# Patient Record
Sex: Female | Born: 1995 | Race: White | Hispanic: No | Marital: Single | Smoking: Never smoker
Health system: Southern US, Community
[De-identification: ages and names within clinical notes are randomized; demographics above are authoritative.]

## PROBLEM LIST (undated history)

## (undated) DIAGNOSIS — F419 Anxiety disorder, unspecified: Secondary | ICD-10-CM

## (undated) DIAGNOSIS — F329 Major depressive disorder, single episode, unspecified: Secondary | ICD-10-CM

## (undated) DIAGNOSIS — J45909 Unspecified asthma, uncomplicated: Secondary | ICD-10-CM

## (undated) DIAGNOSIS — R000 Tachycardia, unspecified: Secondary | ICD-10-CM

## (undated) DIAGNOSIS — F32A Depression, unspecified: Secondary | ICD-10-CM

## (undated) HISTORY — DX: Tachycardia, unspecified: R00.0

## (undated) HISTORY — PX: APPENDECTOMY: SHX54

---
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
## 2017-01-17 ENCOUNTER — Encounter: Payer: Self-pay | Admitting: Family

## 2017-01-17 ENCOUNTER — Ambulatory Visit (INDEPENDENT_AMBULATORY_CARE_PROVIDER_SITE_OTHER): Payer: Medicaid Other | Admitting: Family

## 2017-01-17 DIAGNOSIS — F321 Major depressive disorder, single episode, moderate: Secondary | ICD-10-CM

## 2017-01-17 DIAGNOSIS — F329 Major depressive disorder, single episode, unspecified: Secondary | ICD-10-CM | POA: Insufficient documentation

## 2017-01-17 DIAGNOSIS — J4540 Moderate persistent asthma, uncomplicated: Secondary | ICD-10-CM | POA: Diagnosis not present

## 2017-01-17 DIAGNOSIS — J45909 Unspecified asthma, uncomplicated: Secondary | ICD-10-CM | POA: Insufficient documentation

## 2017-01-17 DIAGNOSIS — E669 Obesity, unspecified: Secondary | ICD-10-CM | POA: Diagnosis not present

## 2017-01-17 DIAGNOSIS — F411 Generalized anxiety disorder: Secondary | ICD-10-CM

## 2017-01-17 DIAGNOSIS — F32A Depression, unspecified: Secondary | ICD-10-CM | POA: Insufficient documentation

## 2017-01-17 MED ORDER — CITALOPRAM HYDROBROMIDE 40 MG PO TABS: 40.0000 mg | ORAL_TABLET | Freq: Every day | ORAL | 1 refills | Status: DC

## 2017-01-17 MED ORDER — BECLOMETHASONE DIPROPIONATE 40 MCG/ACT IN AERS: 2.0000 | INHALATION_SPRAY | Freq: Every day | RESPIRATORY_TRACT | 3 refills | Status: DC

## 2017-01-17 MED ORDER — MONTELUKAST SODIUM 10 MG PO TABS: 10.0000 mg | ORAL_TABLET | Freq: Every day | ORAL | 3 refills | Status: DC

## 2017-01-17 MED ORDER — HYDROXYZINE HCL 50 MG PO TABS: 50.0000 mg | ORAL_TABLET | Freq: Three times a day (TID) | ORAL | 0 refills | Status: DC | PRN

## 2017-01-17 MED ORDER — LAMOTRIGINE 100 MG PO TABS: 100.0000 mg | ORAL_TABLET | Freq: Two times a day (BID) | ORAL | 3 refills | Status: AC

## 2017-01-17 NOTE — Patient Instructions (Signed)
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
## 2017-01-22 ENCOUNTER — Ambulatory Visit: Payer: Medicaid Other | Admitting: General Surgery

## 2017-01-24 ENCOUNTER — Telehealth: Payer: Self-pay | Admitting: Family

## 2017-01-24 NOTE — Telephone Encounter (Signed)
Spoke with pt She will contact Behavioral health for appointment

## 2017-02-01 ENCOUNTER — Other Ambulatory Visit: Payer: Self-pay | Admitting: Family

## 2017-02-01 DIAGNOSIS — F411 Generalized anxiety disorder: Secondary | ICD-10-CM

## 2017-02-15 ENCOUNTER — Other Ambulatory Visit: Payer: Self-pay | Admitting: *Deleted

## 2017-02-15 DIAGNOSIS — F321 Major depressive disorder, single episode, moderate: Secondary | ICD-10-CM

## 2017-02-15 MED ORDER — BECLOMETHASONE DIPROP HFA 40 MCG/ACT IN AERB: 1.0000 | INHALATION_SPRAY | Freq: Two times a day (BID) | RESPIRATORY_TRACT | 3 refills | Status: DC

## 2017-02-28 ENCOUNTER — Ambulatory Visit (INDEPENDENT_AMBULATORY_CARE_PROVIDER_SITE_OTHER): Payer: Medicaid Other | Admitting: Family

## 2017-02-28 ENCOUNTER — Encounter: Payer: Self-pay | Admitting: Family

## 2017-02-28 VITALS — BP 133/80 | HR 83 | Temp 97.8°F | Ht 64.0 in | Wt 209.6 lb

## 2017-02-28 DIAGNOSIS — R110 Nausea: Secondary | ICD-10-CM | POA: Diagnosis not present

## 2017-02-28 DIAGNOSIS — F411 Generalized anxiety disorder: Secondary | ICD-10-CM | POA: Diagnosis not present

## 2017-02-28 DIAGNOSIS — R635 Abnormal weight gain: Secondary | ICD-10-CM

## 2017-02-28 DIAGNOSIS — F321 Major depressive disorder, single episode, moderate: Secondary | ICD-10-CM | POA: Diagnosis not present

## 2017-02-28 DIAGNOSIS — R300 Dysuria: Secondary | ICD-10-CM

## 2017-02-28 LAB — MICROSCOPIC EXAMINATION
Bacteria, UA: NONE SEEN
RBC MICROSCOPIC, UA: NONE SEEN /HPF (ref 0–?)
RENAL EPITHEL UA: NONE SEEN /HPF

## 2017-02-28 LAB — URINALYSIS, COMPLETE
BILIRUBIN UA: NEGATIVE
Glucose, UA: NEGATIVE
Leukocytes, UA: NEGATIVE
Nitrite, UA: NEGATIVE
PH UA: 5.5 (ref 5.0–7.5)
RBC UA: NEGATIVE
Specific Gravity, UA: >1.03 — ABNORMAL HIGH (ref 1.005–1.030)
Urobilinogen, Ur: 0.2 mg/dL (ref 0.2–1.0)

## 2017-02-28 MED ORDER — HYDROXYZINE HCL 50 MG PO TABS: 50.0000 mg | ORAL_TABLET | Freq: Three times a day (TID) | ORAL | 1 refills | Status: DC | PRN

## 2017-02-28 NOTE — Patient Instructions (Signed)

## 2017-02-28 NOTE — Progress Notes (Signed)
Subjective:    Patient ID: Karina Hall, female    DOB: 15-Feb-1996, 21 y.o.   MRN: [id number]  PT presents to the office today to recheck GAD and Depression. Pt states her mother passed away on 02/09/17, the father of her child went to jail, and her sister started having seizures since our last visit. Pt states she is terrified to talk to psychologists and does not want to see them. PT states she has appt next month at [facility], but may call [facility].   PT has nexplanon in place since January. PT states she was bleeding, had cramps, and vaginal discharge. PT states she is having "morning sickness" and wants to have pregnancy test today. Complaining of fatigue and weight gain and wants her thyroid checked.  Anxiety  Presents for follow-up visit. Symptoms include depressed mood, excessive worry, irritability, nausea, nervous/anxious behavior, panic and restlessness. Symptoms occur occasionally. The severity of symptoms is severe. The quality of sleep is good.    Depression         This is a chronic problem.  The current episode started more than 1 year ago.   The onset quality is gradual.   The problem occurs constantly.  The problem has been gradually worsening since onset.  Associated symptoms include fatigue, helplessness, hopelessness, irritable, restlessness and sad.     The symptoms are aggravated by family issues and medication.  Past treatments include SSRIs - Selective serotonin reuptake inhibitors.  Past medical history includes anxiety.   Dysuria   This is a new problem. The current episode started 1 to 4 weeks ago. The problem has been gradually worsening. The quality of the pain is described as burning. The pain is at a severity of 4/10. The pain is moderate. Associated symptoms include a discharge, frequency, hesitancy, nausea and urgency. Pertinent negatives include no hematuria. She has tried increased fluids for the symptoms. The treatment provided mild relief.       Review of Systems  Constitutional: Positive for fatigue and irritability.  Gastrointestinal: Positive for nausea.  Genitourinary: Positive for dysuria, frequency, hesitancy and urgency. Negative for hematuria.  Psychiatric/Behavioral: Positive for depression. The patient is nervous/anxious.   All other systems reviewed and are negative.      Objective:   Physical Exam  Constitutional: She is oriented to person, place, and time. She appears well-developed and well-nourished. She is irritable. No distress.  HENT:  Head: Normocephalic.  Eyes: Pupils are equal, round, and reactive to light.  Neck: Normal range of motion. Neck supple. No thyromegaly present.  Cardiovascular: Normal rate, regular rhythm, normal heart sounds and intact distal pulses.   No murmur heard. Pulmonary/Chest: Effort normal and breath sounds normal. No respiratory distress. She has no wheezes.  Abdominal: Soft. Bowel sounds are normal. She exhibits no distension. There is no tenderness.  Musculoskeletal: Normal range of motion. She exhibits no edema or tenderness.  Neurological: She is alert and oriented to person, place, and time.  Skin: Skin is warm and dry.  Psychiatric: Her behavior is normal. Judgment and thought content normal. Her mood appears anxious.  Pt tearful  Vitals reviewed.     BP 133/80   Pulse 83   Temp 97.8 F (36.6 C) (Oral)   Ht 5' 4"  (1.626 m)   Wt 209 lb 9.6 oz (95.1 kg)   BMI 35.98 kg/m      Assessment & Plan:  1. Current moderate episode of major depressive disorder without prior episode ([place])  Continue Lamictal and Celexa  Stress management  - CMP14+EGFR  2. GAD (generalized anxiety disorder) Continue Lamictal and Celexa  Stress management  - CMP14+EGFR - hydrOXYzine (ATARAX/VISTARIL) 50 MG tablet; Take 1 tablet (50 mg total) by mouth 3 (three) times daily as needed.  Dispense: 90 tablet; Refill: 1  3. Nausea - CMP14+EGFR  4. Weight gain - TSH -  CMP14+EGFR  5. Dysuria - Urinalysis, Complete - CMP14+EGFR   Continue all meds I do not believe she is pregnant since she still has nexplanon in place Labs pending Long discussion that I could not prescribe Benzo's today, She needs to follow up with [place] prn   Evelina Dun, FNP

## 2017-03-01 LAB — CMP14+EGFR
A/G RATIO: 1.7 (ref 1.2–2.2)
ALBUMIN: 4.6 g/dL (ref 3.5–5.5)
ALK PHOS: 85 IU/L (ref 43–101)
ALT: 12 IU/L (ref 0–32)
AST: 17 IU/L (ref 0–40)
BUN / CREAT RATIO: 10 (ref 9–23)
BUN: 10 mg/dL (ref 6–20)
CHLORIDE: 102 mmol/L (ref 96–106)
CO2: 22 mmol/L (ref 20–29)
Calcium: 9.7 mg/dL (ref 8.7–10.2)
Creatinine, Ser: 1.02 mg/dL — ABNORMAL HIGH (ref 0.57–1.00)
GFR calc non Af Amer: 80 mL/min/{1.73_m2} (ref >59)
GFR, EST AFRICAN AMERICAN: 93 mL/min/{1.73_m2} (ref >59)
GLOBULIN, TOTAL: 2.7 g/dL (ref 1.5–4.5)
GLUCOSE: 81 mg/dL (ref 65–99)
POTASSIUM: 4.2 mmol/L (ref 3.5–5.2)
SODIUM: 139 mmol/L (ref 134–144)
Total Protein: 7.3 g/dL (ref 6.0–8.5)

## 2017-03-01 LAB — TSH: TSH: 1.86 u[IU]/mL (ref 0.450–4.500)
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
## 2017-03-07 ENCOUNTER — Ambulatory Visit (HOSPITAL_COMMUNITY): Payer: Self-pay | Admitting: Psychiatry

## 2017-04-05 ENCOUNTER — Telehealth: Payer: Self-pay | Admitting: *Deleted

## 2017-04-05 NOTE — Telephone Encounter (Signed)
[facility] no longer making Qvar 40 mcg Please advise on alternative

## 2017-04-06 MED ORDER — BECLOMETHASONE DIPROP HFA 40 MCG/ACT IN AERB: 2.0000 | INHALATION_SPRAY | Freq: Two times a day (BID) | RESPIRATORY_TRACT | 6 refills | Status: DC

## 2017-04-06 NOTE — Addendum Note (Signed)
Addended by: Jannifer RodneyHAWKS, Deniyah Dillavou A on: 04/06/2017 11:49 AM   Modules accepted: Orders

## 2017-04-06 NOTE — Telephone Encounter (Signed)
QVar changed to QVar redihaler

## 2017-04-27 ENCOUNTER — Other Ambulatory Visit: Payer: Self-pay | Admitting: Family

## 2017-04-27 DIAGNOSIS — F411 Generalized anxiety disorder: Secondary | ICD-10-CM

## 2017-05-07 ENCOUNTER — Telehealth: Payer: Self-pay | Admitting: Family

## 2017-05-07 NOTE — Telephone Encounter (Signed)
Please advise 

## 2017-05-07 NOTE — Telephone Encounter (Signed)
Aware of provider's advise. 

## 2017-05-07 NOTE — Telephone Encounter (Signed)
I do not see birth control on pt's med list. Pt needs to go to store and get pregnancy test. ([facility]) If she is pregnant needs to make appt.

## 2017-05-10 ENCOUNTER — Encounter (HOSPITAL_COMMUNITY): Payer: Self-pay | Admitting: Cardiology

## 2017-05-10 ENCOUNTER — Emergency Department (HOSPITAL_COMMUNITY)
Admission: EM | Admit: 2017-05-10 | Discharge: 2017-05-10 | Disposition: A | Discharge status: Home / Self Care | Payer: Medicaid Other | Attending: Emergency Medicine | Admitting: Emergency Medicine

## 2017-05-10 DIAGNOSIS — Z79899 Other long term (current) drug therapy: Secondary | ICD-10-CM | POA: Insufficient documentation

## 2017-05-10 DIAGNOSIS — J029 Acute pharyngitis, unspecified: Secondary | ICD-10-CM | POA: Insufficient documentation

## 2017-05-10 DIAGNOSIS — J45909 Unspecified asthma, uncomplicated: Secondary | ICD-10-CM | POA: Diagnosis not present

## 2017-05-10 LAB — POC URINE PREG, ED: Preg Test, Ur: NEGATIVE

## 2017-05-10 LAB — RAPID STREP SCREEN (MED CTR MEBANE ONLY): Streptococcus, Group A Screen (Direct): NEGATIVE

## 2017-05-10 NOTE — ED Provider Notes (Signed)
[facility] EMERGENCY DEPARTMENT Provider Note   CSN: [id number] Arrival date & time: 05/10/17  [id number]     History   Chief Complaint Chief Complaint  Patient presents with  . Sore Throat    HPI Karina Hall is a 21 y.o. female.  Patient presents with cough and sore throat for 2 days. Patient was exposed to strep.  Mild aches without fever. No significant medical problems. Patient also is late for her menstrual cycle and took a home pregnancy test is negative. Patient's concerned she may be pregnant. No vaginal symptoms.      Past Medical History:  Diagnosis Date  . Anxiety   . Asthma   . Depression   . Tachycardia     Patient Active Problem List   Diagnosis Date Noted  . GAD (generalized anxiety disorder) 01/17/2017  . Asthma 01/17/2017  . Depression 01/17/2017  . Obesity (BMI 30-39.9) 01/17/2017  . Calculus of gallbladder with acute cholecystitis without obstruction     Past Surgical History:  Procedure Laterality Date  . APPENDECTOMY    . CESAREAN SECTION    . CHOLECYSTECTOMY N/A 11/09/2016   Procedure: LAPAROSCOPIC CHOLECYSTECTOMY;  Surgeon: Franky MachoJenkins, Mark, MD;  Location: [facility];  Service: General;  Laterality: N/A;    OB History    Gravida Para Term Preterm AB Living             1   SAB TAB Ectopic Multiple Live Births                   Home Medications    Prior to Admission medications   Medication Sig Start Date End Date Taking? Authorizing Provider  albuterol (PROVENTIL HFA;VENTOLIN HFA) 108 (90 Base) MCG/ACT inhaler Inhale 2 puffs into the lungs every 6 (six) hours as needed for wheezing or shortness of breath.    [provider]  beclomethasone (QVAR REDIHALER) 40 MCG/ACT inhaler Inhale 2 puffs into the lungs 2 (two) times daily. 04/06/17   Junie SpencerHawks, Christy A, FNP  citalopram (CELEXA) 40 MG tablet Take 1 tablet (40 mg total) by mouth daily. 01/17/17   Jannifer RodneyHawks, Christy A, FNP  hydrOXYzine (ATARAX/VISTARIL) 50 MG tablet TAKE 1 TABLET (50 MG  TOTAL) BY MOUTH 3 (THREE) TIMES DAILY AS NEEDED. 04/29/17   Jannifer RodneyHawks, Christy A, FNP  ibuprofen (ADVIL,MOTRIN) 800 MG tablet TAKE 1 TABLET BY MOUTH THREE TIMES DAILY AS NEEDED 02/05/17   [provider]  lamoTRIgine (LAMICTAL) 100 MG tablet Take 1 tablet (100 mg total) by mouth 2 (two) times daily. 01/17/17   Jannifer RodneyHawks, Christy A, FNP  montelukast (SINGULAIR) 10 MG tablet Take 1 tablet (10 mg total) by mouth at bedtime. 01/17/17   Junie SpencerHawks, Christy A, FNP    Family History Family History  Problem Relation Age of Onset  . Hypertension Mother   . Supraventricular tachycardia Mother   . Hypertension Father   . Hypertension Other   . Diabetes Other   . Heart disease Other     Social History Social History   Tobacco Use  . Smoking status: Never Smoker  . Smokeless tobacco: Never Used  Substance Use Topics  . Alcohol use: Yes    Alcohol/week: 0.0 oz  . Drug use: No     Allergies   Penicillins and Pomegranate [punica]   Review of Systems Review of Systems  Constitutional: Negative for chills and fever.  HENT: Positive for congestion.   Respiratory: Positive for cough. Negative for shortness of breath.   Cardiovascular: Negative  for chest pain.  Gastrointestinal: Negative for abdominal pain and vomiting.  Genitourinary: Negative for dysuria.  Musculoskeletal: Negative for back pain, neck pain and neck stiffness.  Skin: Negative for rash.  Neurological: Negative for light-headedness and headaches.     Physical Exam Updated Vital Signs BP (!) 142/81   Pulse 86   Temp 97.8 F (36.6 C) (Oral)   Resp 18   Ht 5\' 5"  (1.651 m)   Wt 95.3 kg (210 lb)   LMP 04/30/2017   SpO2 100%   BMI 34.95 kg/m   Physical Exam  Constitutional: She appears well-developed and well-nourished.  HENT:  Head: Normocephalic.  Mouth/Throat: Uvula is midline, oropharynx is clear and moist and mucous membranes are normal. No oral lesions. No uvula swelling. No oropharyngeal exudate or posterior  oropharyngeal edema.  Cardiovascular: Normal rate.  Pulmonary/Chest: Effort normal and breath sounds normal.  Nursing note and vitals reviewed.    ED Treatments / Results  Labs (all labs ordered are listed, but only abnormal results are displayed) Labs Reviewed  RAPID STREP SCREEN (NOT AT [facility])  CULTURE, GROUP A STREP ([facility])  POC URINE PREG, ED    EKG  EKG Interpretation None       Radiology No results found.  Procedures Procedures (including critical care time)  Medications Ordered in ED Medications - No data to display   Initial Impression / Assessment and Plan / ED Course  I have reviewed the triage vital signs and the nursing notes.  Pertinent labs & imaging results that were available during my care of the patient were reviewed by me and considered in my medical decision making (see chart for details).    Patient presents with likely viral syndrome however strep exposure plan for strep test and pregnancy test due to missed menstrual cycle.  Results and differential diagnosis were discussed with the patient/parent/guardian. Xrays were independently reviewed by myself.  Close follow up outpatient was discussed, comfortable with the plan.   Medications - No data to display  Vitals:   05/10/17 0802 05/10/17 0803  BP: (!) 142/81   Pulse: 86   Resp: 18   Temp: 97.8 F (36.6 C)   TempSrc: Oral   SpO2: 100%   Weight:  95.3 kg (210 lb)  Height:  5\' 5"  (1.651 m)    Final diagnoses:  Acute pharyngitis, unspecified etiology     Final Clinical Impressions(s) / ED Diagnoses   Final diagnoses:  Acute pharyngitis, unspecified etiology    ED Discharge Orders    None       Blane OharaZavitz, Merideth Bosque, MD 05/10/17 [phone]

## 2017-05-10 NOTE — ED Triage Notes (Signed)
C/o sore throat and cough times 2 days.

## 2017-05-10 NOTE — Discharge Instructions (Addendum)
Take tylenol for pain and fevers. Stay well hydrated  If you were given medicines take as directed.  If you are on coumadin or contraceptives realize their levels and effectiveness is altered by many different medicines.  If you have any reaction (rash, tongues swelling, other) to the medicines stop taking and see a physician.    If your blood pressure was elevated in the ER make sure you follow up for management with a primary doctor or return for chest pain, shortness of breath or stroke symptoms.  Please follow up as directed and return to the ER or see a physician for new or worsening symptoms.  Thank you. Vitals:   05/10/17 0802 05/10/17 0803  BP: (!) 142/81   Pulse: 86   Resp: 18   Temp: 97.8 F (36.6 C)   TempSrc: Oral   SpO2: 100%   Weight:  95.3 kg (210 lb)  Height:  5\' 5"  (1.651 m)

## 2017-05-12 LAB — CULTURE, GROUP A STREP (THRC)

## 2017-05-13 ENCOUNTER — Ambulatory Visit: Payer: Self-pay | Admitting: Family

## 2017-06-17 ENCOUNTER — Other Ambulatory Visit: Payer: Self-pay | Admitting: Family

## 2017-06-17 DIAGNOSIS — F411 Generalized anxiety disorder: Secondary | ICD-10-CM

## 2017-06-18 ENCOUNTER — Telehealth: Payer: Self-pay | Admitting: Family

## 2017-06-18 ENCOUNTER — Telehealth: Payer: Self-pay | Admitting: *Deleted

## 2017-06-18 MED ORDER — BECLOMETHASONE DIPROP HFA 40 MCG/ACT IN AERB: 2.0000 | INHALATION_SPRAY | Freq: Two times a day (BID) | RESPIRATORY_TRACT | 5 refills | Status: AC

## 2017-06-18 NOTE — Telephone Encounter (Signed)
Fax received Regular Qvar no longer available Please Rx Avar 40 [facility] [address]

## 2017-06-18 NOTE — Telephone Encounter (Signed)
Prescription sent to pharmacy.

## 2017-06-24 NOTE — Telephone Encounter (Signed)
No response from patient. Note filed. 

## 2017-07-14 ENCOUNTER — Other Ambulatory Visit: Payer: Self-pay | Admitting: Family

## 2017-07-14 DIAGNOSIS — F411 Generalized anxiety disorder: Secondary | ICD-10-CM

## 2017-07-14 DIAGNOSIS — J4540 Moderate persistent asthma, uncomplicated: Secondary | ICD-10-CM

## 2017-07-14 DIAGNOSIS — F321 Major depressive disorder, single episode, moderate: Secondary | ICD-10-CM

## 2017-07-17 ENCOUNTER — Other Ambulatory Visit: Payer: Self-pay | Admitting: Family

## 2017-07-17 DIAGNOSIS — J4540 Moderate persistent asthma, uncomplicated: Secondary | ICD-10-CM

## 2017-07-17 DIAGNOSIS — F411 Generalized anxiety disorder: Secondary | ICD-10-CM

## 2017-07-18 NOTE — Telephone Encounter (Signed)
Last seen 02/28/17

## 2017-07-28 ENCOUNTER — Emergency Department (HOSPITAL_COMMUNITY): Payer: Medicaid Other

## 2017-07-28 ENCOUNTER — Other Ambulatory Visit: Payer: Self-pay

## 2017-07-28 ENCOUNTER — Emergency Department (HOSPITAL_COMMUNITY)
Admission: EM | Admit: 2017-07-28 | Discharge: 2017-07-28 | Disposition: A | Discharge status: Home / Self Care | Payer: Medicaid Other | Attending: Emergency Medicine | Admitting: Emergency Medicine

## 2017-07-28 ENCOUNTER — Encounter (HOSPITAL_COMMUNITY): Payer: Self-pay | Admitting: Emergency Medicine

## 2017-07-28 DIAGNOSIS — Z79899 Other long term (current) drug therapy: Secondary | ICD-10-CM | POA: Insufficient documentation

## 2017-07-28 DIAGNOSIS — Y999 Unspecified external cause status: Secondary | ICD-10-CM | POA: Insufficient documentation

## 2017-07-28 DIAGNOSIS — S9032XA Contusion of left foot, initial encounter: Secondary | ICD-10-CM

## 2017-07-28 DIAGNOSIS — Y929 Unspecified place or not applicable: Secondary | ICD-10-CM | POA: Insufficient documentation

## 2017-07-28 DIAGNOSIS — J45909 Unspecified asthma, uncomplicated: Secondary | ICD-10-CM | POA: Insufficient documentation

## 2017-07-28 DIAGNOSIS — S99922A Unspecified injury of left foot, initial encounter: Secondary | ICD-10-CM | POA: Diagnosis present

## 2017-07-28 DIAGNOSIS — W231XXA Caught, crushed, jammed, or pinched between stationary objects, initial encounter: Secondary | ICD-10-CM | POA: Diagnosis not present

## 2017-07-28 DIAGNOSIS — Y939 Activity, unspecified: Secondary | ICD-10-CM | POA: Insufficient documentation

## 2017-07-28 NOTE — ED Triage Notes (Signed)
Pt states she ran over her foot with a "pallet jack" 3 weeks ago. Pt c/o pain and swelling. Pt ambulatory.

## 2017-07-28 NOTE — Discharge Instructions (Signed)
Elevate and apply ice packs on and off to your foot when possible.  Wear the Ace wrap as needed for support, but do not wear continuously and remove at bedtime.  You may continue ibuprofen or Aleve as directed if needed for pain.  Call Dr. Mort SawyersHarrison's office in 1 week if symptoms are not improving.

## 2017-07-28 NOTE — ED Provider Notes (Signed)
[facility] EMERGENCY DEPARTMENT Provider Note   CSN: [id number] Arrival date & time: 07/28/17  2029     History   Chief Complaint Chief Complaint  Patient presents with  . Foot Injury    HPI Karina Hall is a 22 y.o. female.  HPI   Karina Hall is a 22 y.o. female who presents to the Emergency Department complaining of left foot pain for 3 weeks.  She states that a pallet jack rolled over her foot at the time her pain began.  She notes persistent pain and swelling to her foot that is worse with weightbearing.  She reports having a small area of bruising to the top of her foot that when pressed causes numbness and tingling to her toes.  She is tried ice and heat, Tylenol and ibuprofen without relief.  She denies fall, open wound, numbness or tingling proximal to the ankle.     Past Medical History:  Diagnosis Date  . Anxiety   . Asthma   . Depression   . Tachycardia     Patient Active Problem List   Diagnosis Date Noted  . GAD (generalized anxiety disorder) 01/17/2017  . Asthma 01/17/2017  . Depression 01/17/2017  . Obesity (BMI 30-39.9) 01/17/2017  . Calculus of gallbladder with acute cholecystitis without obstruction     Past Surgical History:  Procedure Laterality Date  . APPENDECTOMY    . CESAREAN SECTION    . CESAREAN SECTION    . CHOLECYSTECTOMY N/A 11/09/2016   Procedure: LAPAROSCOPIC CHOLECYSTECTOMY;  Surgeon: Franky Macho, MD;  Location: [facility];  Service: General;  Laterality: N/A;    OB History    Gravida Para Term Preterm AB Living             1   SAB TAB Ectopic Multiple Live Births                   Home Medications    Prior to Admission medications   Medication Sig Start Date End Date Taking? Authorizing Provider  albuterol (PROVENTIL HFA;VENTOLIN HFA) 108 (90 Base) MCG/ACT inhaler Inhale 2 puffs into the lungs every 6 (six) hours as needed for wheezing or shortness of breath.    [provider]  beclomethasone (QVAR  REDIHALER) 40 MCG/ACT inhaler Inhale 2 puffs into the lungs 2 (two) times daily. 06/18/17   Junie Spencer, FNP  citalopram (CELEXA) 40 MG tablet TAKE 1 TABLET BY MOUTH EVERY DAY 07/15/17   Jannifer Rodney A, FNP  citalopram (CELEXA) 40 MG tablet TAKE 1 TABLET BY MOUTH EVERY DAY 07/18/17   Jannifer Rodney A, FNP  hydrOXYzine (ATARAX/VISTARIL) 50 MG tablet TAKE 1 TABLET BY MOUTH THREE TIMES DAILY AS NEEDED 06/18/17   Jannifer Rodney A, FNP  ibuprofen (ADVIL,MOTRIN) 800 MG tablet TAKE 1 TABLET BY MOUTH THREE TIMES DAILY AS NEEDED 02/05/17   [provider]  lamoTRIgine (LAMICTAL) 100 MG tablet Take 1 tablet (100 mg total) by mouth 2 (two) times daily. 01/17/17   Junie Spencer, FNP  montelukast (SINGULAIR) 10 MG tablet TAKE 1 TABLET BY MOUTH EVERYDAY AT BEDTIME 07/15/17   Junie Spencer, FNP    Family History Family History  Problem Relation Age of Onset  . Hypertension Mother   . Supraventricular tachycardia Mother   . Hypertension Father   . Hypertension Other   . Diabetes Other   . Heart disease Other     Social History Social History   Tobacco Use  . Smoking  status: Never Smoker  . Smokeless tobacco: Never Used  Substance Use Topics  . Alcohol use: No    Alcohol/week: 0.0 oz    Frequency: Never  . Drug use: No     Allergies   Penicillins and Pomegranate [punica]   Review of Systems Review of Systems  Constitutional: Negative for chills and fever.  Genitourinary: Negative for difficulty urinating and dysuria.  Musculoskeletal: Positive for arthralgias (Left foot pain) and joint swelling.  Skin: Negative for color change and wound.  Neurological: Negative for weakness and numbness.  All other systems reviewed and are negative.    Physical Exam Updated Vital Signs BP 126/81 (BP Location: Right Arm)   Pulse (!) 108   Temp 98.3 F (36.8 C) (Oral)   Resp 18   Ht 5\' 4"  (1.626 m)   Wt 99.8 kg (220 lb)   LMP 07/08/2017 (Exact Date)   SpO2 100%   BMI 37.76 kg/m    Physical Exam  Constitutional: She is oriented to person, place, and time. She appears well-developed and well-nourished. No distress.  HENT:  Head: Normocephalic and atraumatic.  Cardiovascular: Normal rate, regular rhythm and intact distal pulses.  Pulmonary/Chest: Effort normal and breath sounds normal.  Musculoskeletal: She exhibits tenderness. She exhibits no edema or deformity.       Left foot: There is tenderness. There is normal range of motion, no swelling, normal capillary refill and no deformity.       Feet:  Focal tenderness to palpation of the dorsal left foot.  Small area of ecchymosis.  No edema.  No proximal tenderness or edema.  Neurological: She is alert and oriented to person, place, and time. No sensory deficit. She exhibits normal muscle tone. Coordination normal.  Skin: Skin is warm and dry. Capillary refill takes less than 2 seconds. No rash noted.  Nursing note and vitals reviewed.    ED Treatments / Results  Labs (all labs ordered are listed, but only abnormal results are displayed) Labs Reviewed - No data to display  EKG  EKG Interpretation None       Radiology Dg Foot Complete Left  Result Date: 07/28/2017 CLINICAL DATA:  Left foot pain, recent injury, pain and swelling. EXAM: LEFT FOOT - COMPLETE 3+ VIEW COMPARISON:  None. FINDINGS: Osseous alignment is normal. Bone mineralization is normal. No fracture line or displaced fracture fragment identified. Adjacent soft tissues are unremarkable. IMPRESSION: Negative.  No fracture or dislocation. Electronically Signed   By: Bary RichardStan  Maynard M.D.   On: 07/28/2017 21:45    Procedures Procedures (including critical care time)  Medications Ordered in ED Medications - No data to display   Initial Impression / Assessment and Plan / ED Course  I have reviewed the triage vital signs and the nursing notes.  Pertinent labs & imaging results that were available during my care of the patient were reviewed by me  and considered in my medical decision making (see chart for details).     Likely contusion.  XR neg for fx.  NV intact.  Pt agrees to tx plan of elevate, ice and NSAID.    ACE wrap applied for support  Final Clinical Impressions(s) / ED Diagnoses   Final diagnoses:  Contusion of left foot, initial encounter    ED Discharge Orders    None       Rosey Bathriplett, Mareon Robinette, PA-C 07/28/17 2211    Raeford RazorKohut, Stephen, MD 07/29/17 2312

## 2017-10-10 ENCOUNTER — Other Ambulatory Visit: Payer: Self-pay | Admitting: Family

## 2017-10-10 DIAGNOSIS — F411 Generalized anxiety disorder: Secondary | ICD-10-CM

## 2017-10-10 DIAGNOSIS — J4540 Moderate persistent asthma, uncomplicated: Secondary | ICD-10-CM

## 2017-10-10 DIAGNOSIS — F321 Major depressive disorder, single episode, moderate: Secondary | ICD-10-CM

## 2019-05-11 IMAGING — DX DG CHEST 2V
2 series · 2 of 2 positions shown · non-contrast
Comparison: 10/30/2016

CLINICAL DATA: Mid chest pain and dyspnea since 2 a.m. with cough.

EXAM:
CHEST  2 VIEW

[chest pa]
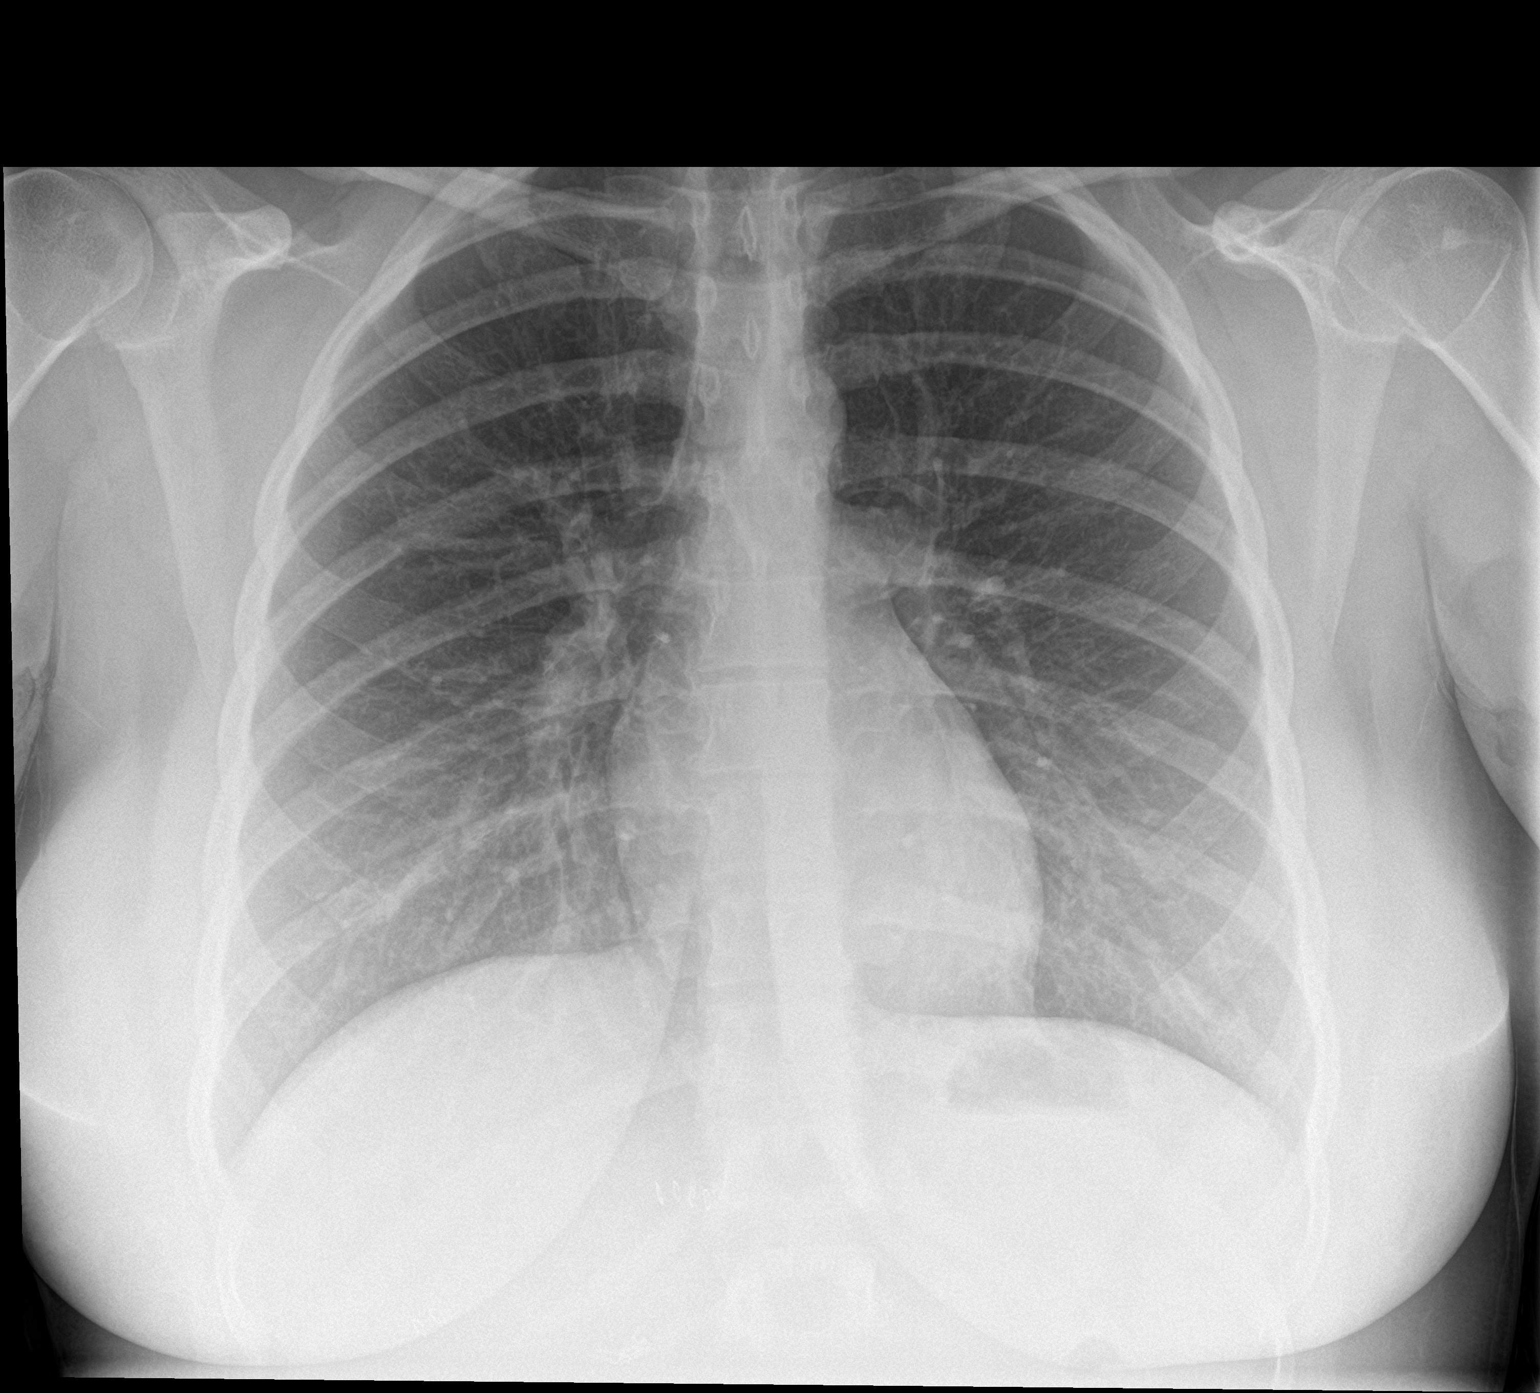

[chest lat]
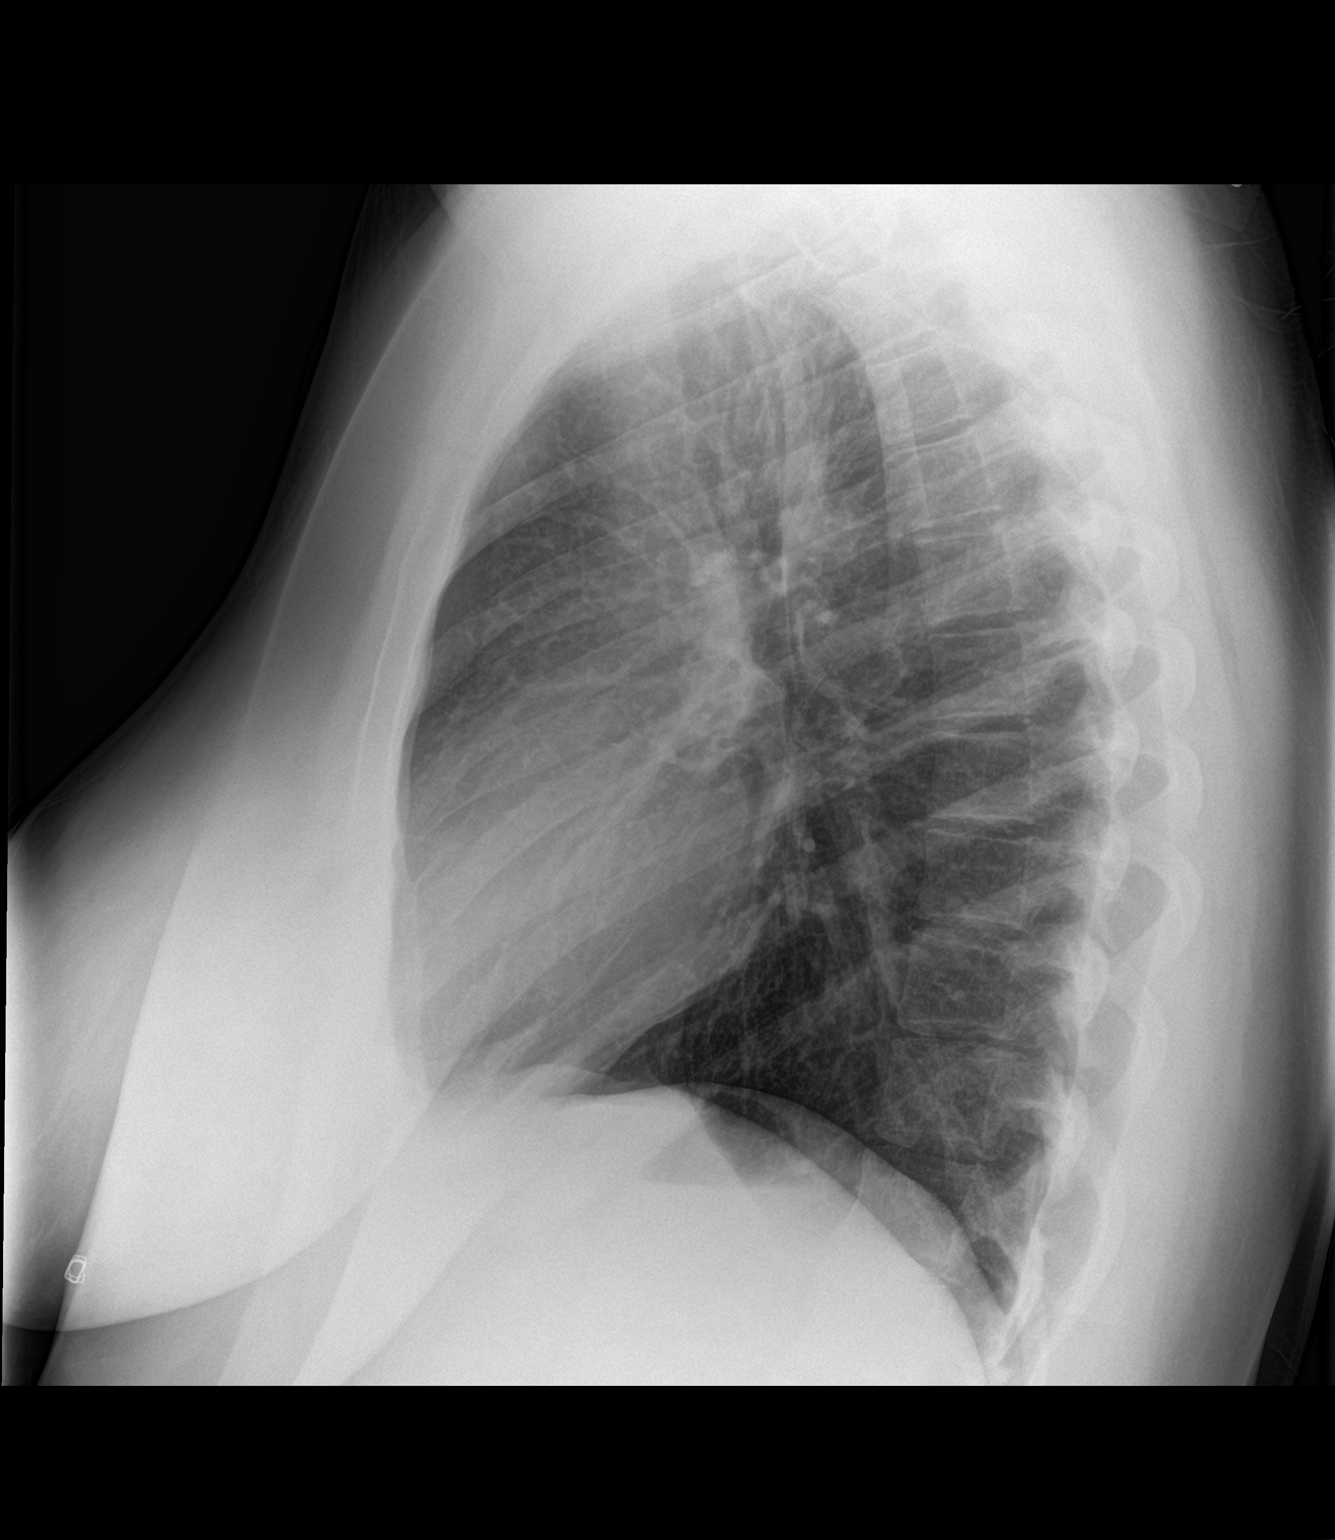

[2 of 2 positions shown; findings below may reference images not displayed]

FINDINGS: The heart size and mediastinal contours are within normal limits.
Both lungs are clear. The visualized skeletal structures are
unremarkable.
IMPRESSION: No active cardiopulmonary disease.

## 2019-05-11 IMAGING — CT CT ABD-PELV W/ CM
2 of 4 series · 16 of 46 positions shown, 18 images · IV contrast (Isovue)
Comparison: CT 04/01/2016

CLINICAL DATA: Right upper quadrant pain and vomiting.
Cholecystectomy 4 days prior.

EXAM:
CT ABDOMEN AND PELVIS WITH CONTRAST
TECHNIQUE: Multidetector CT imaging of the abdomen and pelvis was performed
using the standard protocol following bolus administration of
intravenous contrast.
CONTRAST:  100mL [id number] IOPAMIDOL ([id number]) INJECTION 61%

[Series 2: axial st · axial · 0.91mm/px · z∈[-300,+120]mm · 13 of 94 slices shown, 15 images]
[im 5/94  soft-tissue]
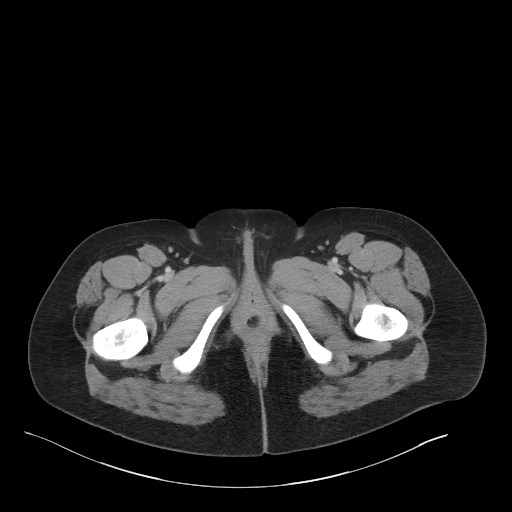
[im 5/94  bone]
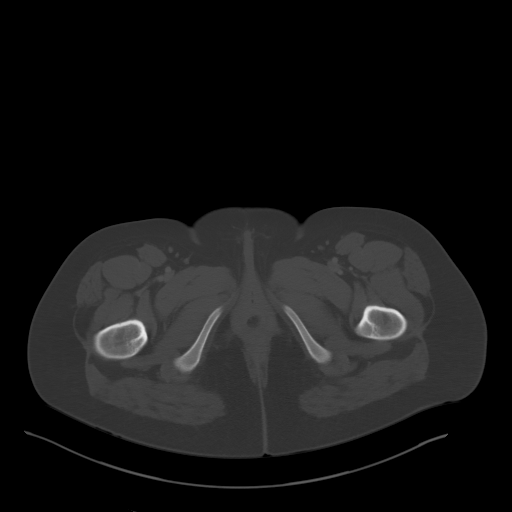
[im 14/94  soft-tissue]
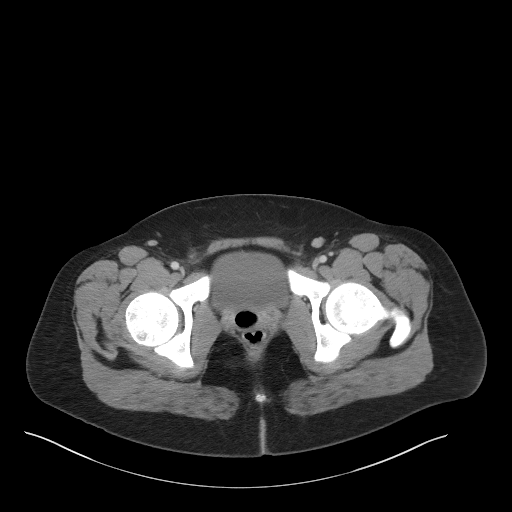
[im 18/94  soft-tissue]
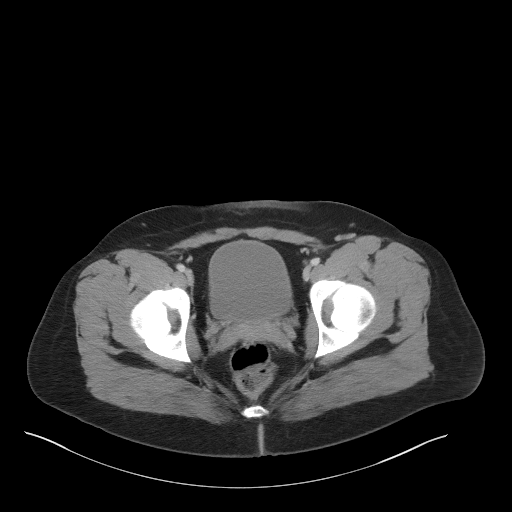
[im 27/94  soft-tissue]
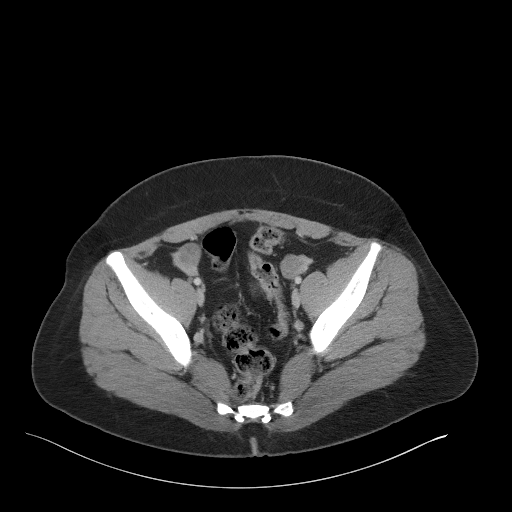
[im 32/94  soft-tissue]
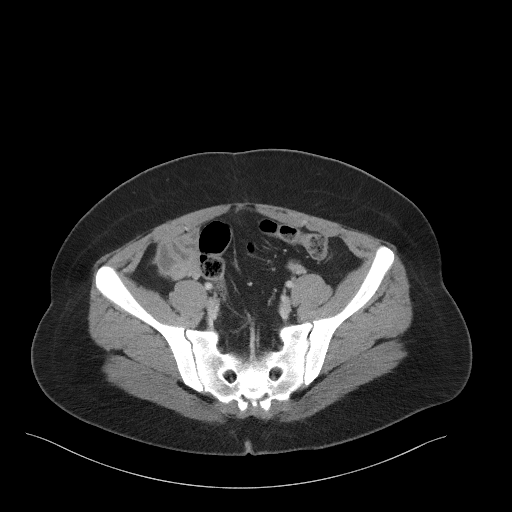
[im 40/94  soft-tissue]
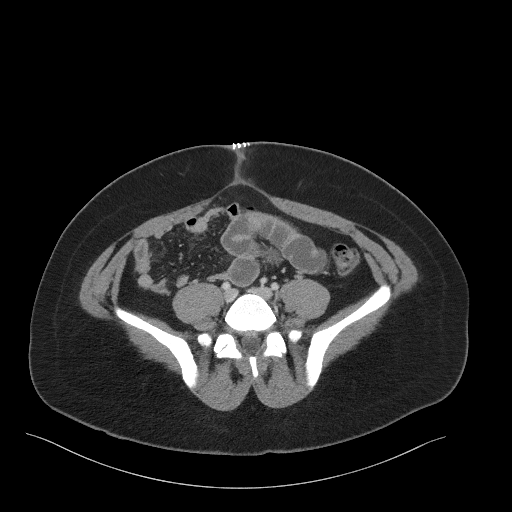
[im 49/94  soft-tissue]
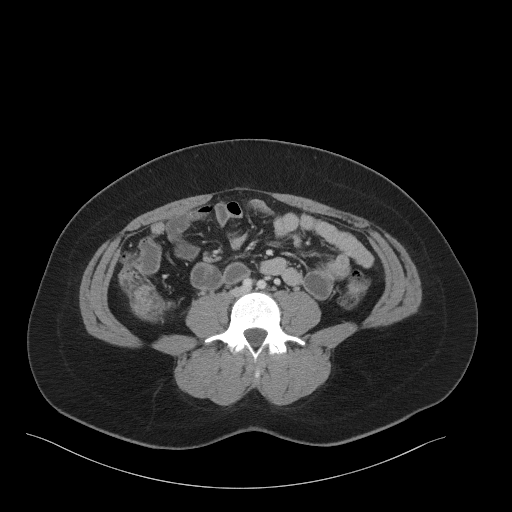
[im 54/94  soft-tissue]
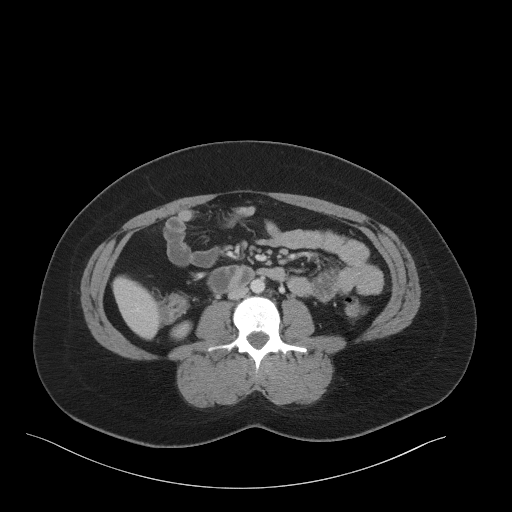
[im 63/94  soft-tissue]
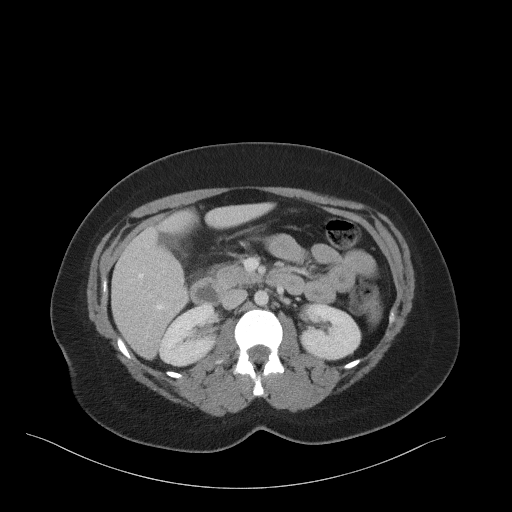
[im 63/94  bone]
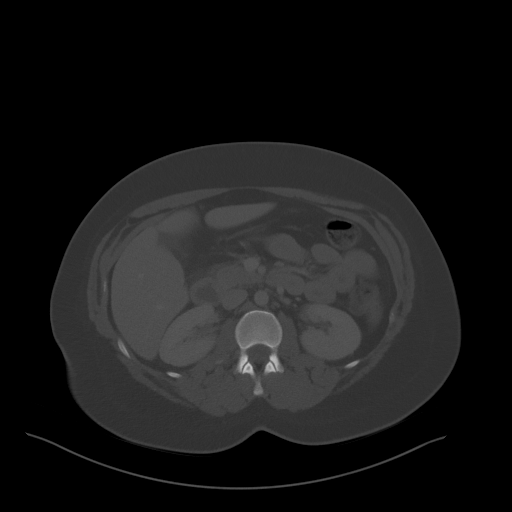
[im 67/94  soft-tissue]
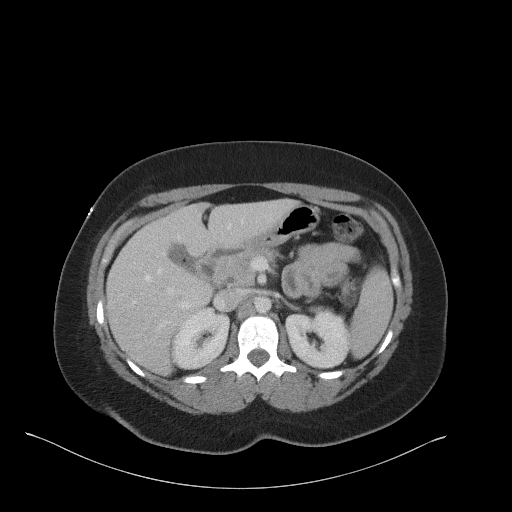
[im 76/94  soft-tissue]
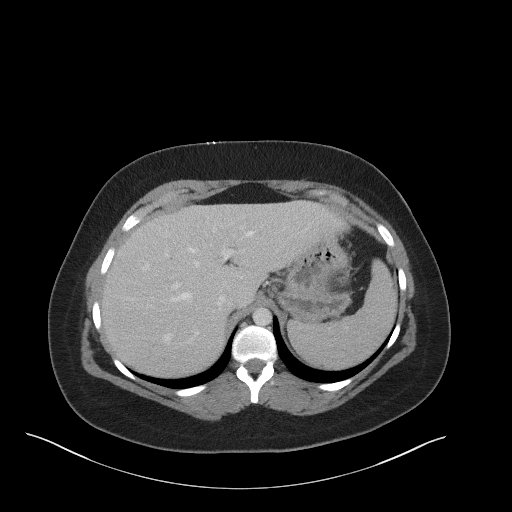
[im 80/94  soft-tissue]
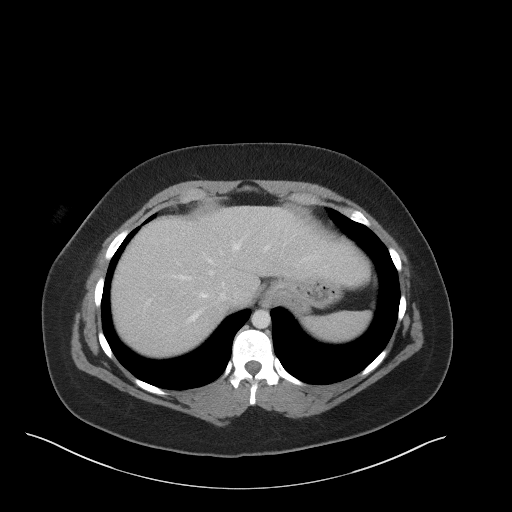
[im 89/94  soft-tissue]
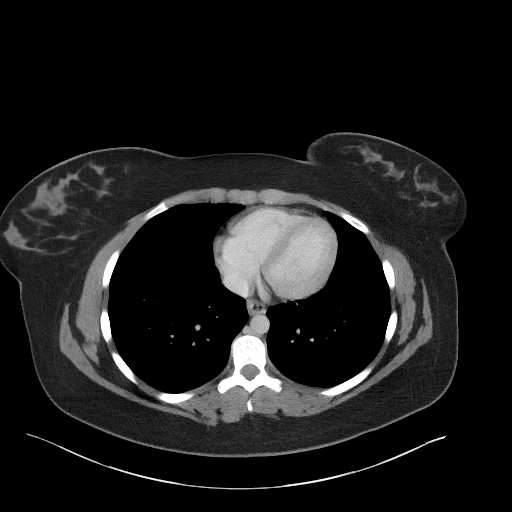

[Series 5: coronal st · coronal · 0.81mm/px · 3 of 92 slices shown]
[im 31/92  soft-tissue]
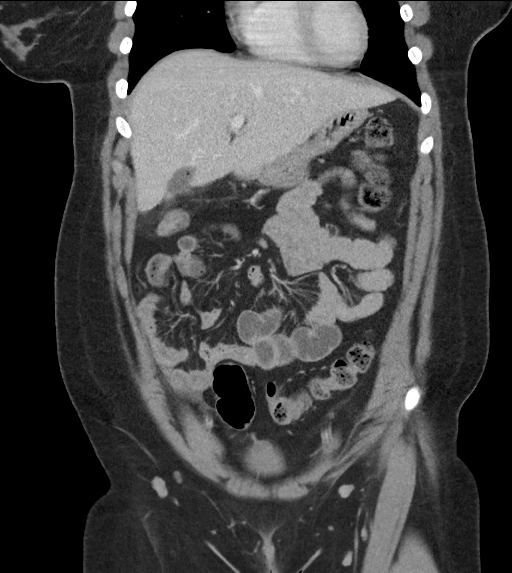
[im 41/92  soft-tissue]
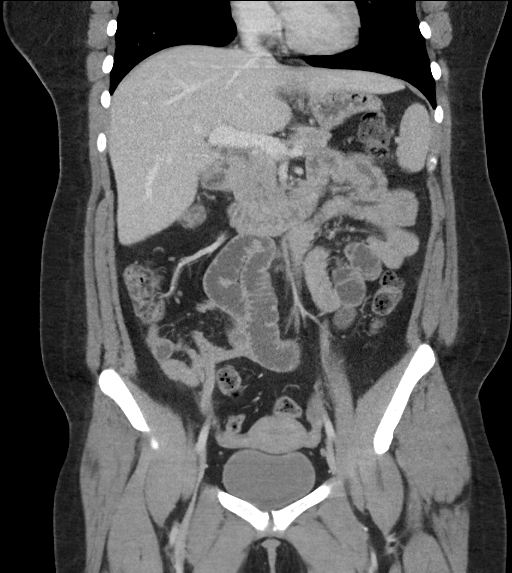
[im 51/92  soft-tissue]
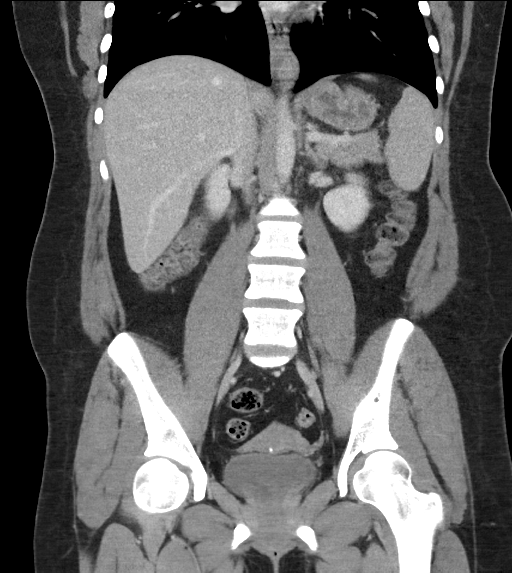

[16 of 46 positions shown; findings below may reference images not displayed]

FINDINGS: Lower chest: The lung bases are clear.

Hepatobiliary: Post recent cholecystectomy. Minimal fluid in the
cholecystectomy bed measures 15 x 3.1 mm and contains small focus of
air no biliary dilatation. No focal hepatic lesion.

Pancreas: No ductal dilatation or inflammation.

Spleen: Normal in size without focal abnormality.

Adrenals/Urinary Tract: Adrenal glands are unremarkable. Kidneys are
normal, without renal calculi, focal lesion, or hydronephrosis.
Bladder is unremarkable.

Stomach/Bowel: Stomach is decompressed. Appendix is surgically
absent with clips at the base of the cecum. No evidence of bowel
wall thickening, distention, or inflammatory changes.

Vascular/Lymphatic: No significant vascular findings are present.
Portal vein is patent. Diffuse central mesenteric nodes are likely
reactive. No retroperitoneal or pelvic adenopathy.

Reproductive: Uterus and bilateral adnexa are unremarkable. Tampon
in the vagina.

Other: No free air. No ascites or pelvic free fluid. Minimal edema
in the anterior abdominal wall at port sites, no subcutaneous
collection.

Musculoskeletal: There are no acute or suspicious osseous
abnormalities.
IMPRESSION: Post recent cholecystectomy. Small amount of nonspecific fluid in
the cholecystectomy bed contains a central focus of air. This may be
simple postoperative fluid/seroma, however infected fluid collection
or biloma are also considerations. HIDA scan could be considered to
evaluate for bile leak.
